# Patient Record
Sex: Male | Born: 1970 | Race: Asian | Hispanic: No | Smoking: Never smoker
Health system: Southern US, Community
[De-identification: ages and names within clinical notes are randomized; demographics above are authoritative.]

---

## 2014-09-05 ENCOUNTER — Emergency Department (HOSPITAL_COMMUNITY): Payer: No Typology Code available for payment source

## 2014-09-05 ENCOUNTER — Emergency Department (HOSPITAL_COMMUNITY)
Admission: EM | Admit: 2014-09-05 | Discharge: 2014-09-05 | Disposition: A | Discharge status: Home / Self Care | Payer: No Typology Code available for payment source | Attending: Emergency Medicine | Admitting: Emergency Medicine

## 2014-09-05 DIAGNOSIS — R0789 Other chest pain: Secondary | ICD-10-CM

## 2014-09-05 DIAGNOSIS — IMO0002 Reserved for concepts with insufficient information to code with codable children: Secondary | ICD-10-CM | POA: Insufficient documentation

## 2014-09-05 DIAGNOSIS — Y9389 Activity, other specified: Secondary | ICD-10-CM | POA: Insufficient documentation

## 2014-09-05 DIAGNOSIS — S298XXA Other specified injuries of thorax, initial encounter: Secondary | ICD-10-CM | POA: Insufficient documentation

## 2014-09-05 DIAGNOSIS — Y92410 Unspecified street and highway as the place of occurrence of the external cause: Secondary | ICD-10-CM | POA: Insufficient documentation

## 2014-09-05 MED ORDER — HYDROCODONE-ACETAMINOPHEN 5-325 MG PO TABS: 1.0000 | ORAL_TABLET | ORAL | Status: DC | PRN

## 2014-09-05 MED ORDER — HYDROCODONE-ACETAMINOPHEN 5-325 MG PO TABS
1.0000 | ORAL_TABLET | Freq: Once | ORAL | Status: AC
  Administered 2014-09-05: 1 via ORAL
  Filled 2014-09-05: qty 1
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
## 2014-09-05 NOTE — Discharge Instructions (Signed)
Take the prescribed medication as directed for pain.  You chest may bruise, monitor this closely. Return to the ED for new or worsening symptoms.
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
## 2014-09-06 NOTE — ED Provider Notes (Signed)
Medical screening examination/treatment/procedure(s) were performed by non-physician practitioner and as supervising physician I was immediately available for consultation/collaboration.   EKG Interpretation None       Yeimi Debnam, MD 09/06/14 0143 

## 2017-05-03 ENCOUNTER — Ambulatory Visit: Payer: Worker's Compensation

## 2017-05-03 ENCOUNTER — Ambulatory Visit
Admission: EM | Admit: 2017-05-03 | Discharge: 2017-05-03 | Disposition: A | Discharge status: Home / Self Care | Payer: Worker's Compensation | Attending: Internal Medicine | Admitting: Internal Medicine

## 2017-05-03 ENCOUNTER — Ambulatory Visit (INDEPENDENT_AMBULATORY_CARE_PROVIDER_SITE_OTHER): Payer: Worker's Compensation

## 2017-05-03 DIAGNOSIS — S63501A Unspecified sprain of right wrist, initial encounter: Secondary | ICD-10-CM

## 2017-05-03 DIAGNOSIS — S6991XA Unspecified injury of right wrist, hand and finger(s), initial encounter: Secondary | ICD-10-CM | POA: Diagnosis not present

## 2017-05-03 MED ORDER — NAPROXEN 500 MG PO TABS: 500.0000 mg | ORAL_TABLET | Freq: Two times a day (BID) | ORAL | 0 refills | Status: DC | PRN

## 2017-05-03 NOTE — Discharge Instructions (Signed)
-  wear wrist splint until symptoms improve -avoid aggravating activity -Naprosyn twice daily with meals as needed for pain -continue with ice, elevation -can also use OTC Tylenol and ibuprofen for pain -Numbers provided for [facility] and [facility] in [place] should symptoms not improve.

## 2017-05-03 NOTE — ED Provider Notes (Signed)
CSN: [id number]     Arrival date & time 05/03/17  1142 History   First MD Initiated Contact with Patient 05/03/17 1235     Chief Complaint  Patient presents with  . Hand Injury   (Consider location/radiation/quality/duration/timing/severity/associated sxs/prior Treatment) Patient is a 46 year old male who presents with complaint to the right hand and wrist. Patient was using a drill while drilling into a piece of pipe on Wednesday when the digit locked in the pipe and the drill torqued and twisted hard in his hand. Patient woke up Thursday with swelling and mild pain that was reported as a 3 out of 10. The injury was treated with cold compress, and Ace bandage around the hand and wrist, and elevation. Patient did not take any pain medications.With still having pain and swelling today, decided to come in for evaluation and x-rays.      History reviewed. No pertinent past medical history. History reviewed. No pertinent surgical history. History reviewed. No pertinent family history. Social History  Substance Use Topics  . Smoking status: Never Smoker  . Smokeless tobacco: Never Used  . Alcohol use No    Review of Systems  Musculoskeletal: Positive for joint swelling (nd pain to the right wrist and hand).    Allergies  Patient has no known allergies.  Home Medications   Prior to Admission medications   Medication Sig Start Date End Date Taking? Authorizing Provider  HYDROcodone-acetaminophen (NORCO/VICODIN) 5-325 MG per tablet Take 1 tablet by mouth every 4 (four) hours as needed. 09/05/14   Garlon HatchetSanders, Lisa M, PA-C  naproxen (NAPROSYN) 500 MG tablet Take 1 tablet (500 mg total) by mouth 2 (two) times daily as needed for mild pain or moderate pain. 05/03/17   Candis SchatzHarris, Kiah Vanalstine D, PA-C   Meds Ordered and Administered this Visit  Medications - No data to display  BP 120/81 (BP Location: Left Arm)   Pulse 74   Temp 98 F (36.7 C) (Oral)   Resp 16   Ht 5\' 1"  (1.549 m)   Wt 117 lb  (53.1 kg)   SpO2 100%   BMI 22.11 kg/m  No data found.   Physical Exam  Constitutional: He appears well-developed and well-nourished.  HENT:  Head: Normocephalic.  Eyes: Conjunctivae are normal. Pupils are equal, round, and reactive to light.  Musculoskeletal: Normal range of motion.       Right hand: He exhibits tenderness ( tenderness with flexion and extension of the wrist) and swelling.       Hands: Erythema to the back of the hand     Urgent Care Course     Procedures (including critical care time)  Labs Review Labs Reviewed - No data to display  Imaging Review Dg Wrist Complete Right  Result Date: 05/03/2017 CLINICAL DATA:  Pt was using drill 3 days ago and drill torqued and injured right hand and wrist. Most pain medial and lat carpal area and swelling on top of right hand over the 2nd thru 5th MC bones EXAM: RIGHT WRIST - COMPLETE 3+ VIEW COMPARISON:  None. FINDINGS: There is no evidence of fracture or dislocation. There is no evidence of arthropathy or other focal bone abnormality. Soft tissues are unremarkable. IMPRESSION: Negative. Electronically Signed   By: Corlis Leak  Hassell M.D.   On: 05/03/2017 13:15   Dg Hand Complete Right  Result Date: 05/03/2017 CLINICAL DATA:  Twisted wrist using to drill EXAM: RIGHT HAND - COMPLETE 3+ VIEW COMPARISON:  None FINDINGS: There is no evidence of fracture  or dislocation. There is no evidence of arthropathy or other focal bone abnormality. Dorsal soft tissue swelling noted. IMPRESSION: Negative. Electronically Signed   By: Signa Kell M.D.   On: 05/03/2017 13:16        MDM   1. Injury of right hand, initial encounter   2. Sprain of right wrist, initial encounter    New Prescriptions   NAPROXEN (NAPROSYN) 500 MG TABLET    Take 1 tablet (500 mg total) by mouth 2 (two) times daily as needed for mild pain or moderate pain.   Patient presents with complaint of right wrist and hand pain after his hand was twisted by a drill on  Wednesday. Patient with some tenderness to palpation to  Base of the right thumb as well as the ulnar aspect of the wrist with some associated swelling. X-rays as above with no obvious fracture.  Patient like Patient was placed into a Velcro splint and patient given a prescription for Naprosyn as needed patient advised to continue his ice compression and elevation. Patient also given contact information for [facility] and [facility] in [place] should his symptoms not improve. Patient also given directions for hand and wrist exercises. Patient verbalized understanding these agreement with plan.  Candis Schatz, PA-C    Candis Schatz, PA-C 05/03/17 1335

## 2017-05-03 NOTE — ED Triage Notes (Signed)
Pt with right hand swelling and mild redness. Was using a drill on Wednesday and woke up the next day with sx. Pain 3/10. Mildly warm to touch.

## 2020-05-26 ENCOUNTER — Ambulatory Visit (HOSPITAL_COMMUNITY)
Admission: EM | Admit: 2020-05-26 | Discharge: 2020-05-26 | Disposition: A | Discharge status: Home / Self Care | Payer: 59 | Attending: Emergency Medicine | Admitting: Emergency Medicine

## 2020-05-26 ENCOUNTER — Encounter (HOSPITAL_COMMUNITY): Payer: Self-pay | Admitting: Emergency Medicine

## 2020-05-26 DIAGNOSIS — M25531 Pain in right wrist: Secondary | ICD-10-CM | POA: Diagnosis not present

## 2020-05-26 DIAGNOSIS — M25532 Pain in left wrist: Secondary | ICD-10-CM | POA: Diagnosis not present

## 2020-05-26 MED ORDER — NAPROXEN 500 MG PO TABS: 500.0000 mg | ORAL_TABLET | Freq: Two times a day (BID) | ORAL | 0 refills | Status: DC

## 2020-05-26 NOTE — ED Provider Notes (Signed)
[place]    CSN: [id number] Arrival date & time: 05/26/20  1028      History   Chief Complaint Chief Complaint  Patient presents with  . Hand Pain  . Foot Pain    HPI Max Klein is a 49 y.o. male no significant past medical history presenting today for evaluation of bilateral wrist pain.  Patient reports over the past week he has had pain in both of his wrist.  He denies one worse than the other.  Denies any redness or swelling.  Denies any specific injury, but states that this increased after he did some heavy lifting.  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  Denies history of wrist issues.  He also has had bilateral ankle pain.  HPI  History reviewed. No pertinent past medical history.  There are no problems to display for this patient.   History reviewed. No pertinent surgical history.     Home Medications    Prior to Admission medications   Medication Sig Start Date End Date Taking? Authorizing Provider  naproxen (NAPROSYN) 500 MG tablet Take 1 tablet (500 mg total) by mouth 2 (two) times daily. 05/26/20   Latyra Jaye, Elesa Hacker, PA-C    Family History Family History  Family history unknown: Yes    Social History Social History   Tobacco Use  . Smoking status: Never Smoker  . Smokeless tobacco: Never Used  Substance Use Topics  . Alcohol use: No  . Drug use: No     Allergies   Patient has no known allergies.   Review of Systems Review of Systems  Constitutional: Negative for fatigue and fever.  Eyes: Negative for redness, itching and visual disturbance.  Respiratory: Negative for shortness of breath.   Cardiovascular: Negative for chest pain and leg swelling.  Gastrointestinal: Negative for nausea and vomiting.  Musculoskeletal: Positive for arthralgias. Negative for myalgias.  Skin: Negative for color change, rash and wound.  Neurological: Negative for dizziness, syncope, weakness, light-headedness and  headaches.     Physical Exam Triage Vital Signs ED Triage Vitals  Enc Vitals Group     BP 05/26/20 1102 128/81     Pulse Rate 05/26/20 1102 90     Resp 05/26/20 1102 16     Temp 05/26/20 1102 97.6 F (36.4 C)     Temp Source 05/26/20 1102 Oral     SpO2 05/26/20 1102 98 %     Weight --      Height --      Head Circumference --      Peak Flow --      Pain Score 05/26/20 1057 2     Pain Loc --      Pain Edu? --      Excl. in [place]? --    No data found.  Updated Vital Signs BP 128/81 (BP Location: Left Arm)   Pulse 90   Temp 97.6 F (36.4 C) (Oral)   Resp 16   SpO2 98%   Visual Acuity Right Eye Distance:   Left Eye Distance:   Bilateral Distance:    Right Eye Near:   Left Eye Near:    Bilateral Near:     Physical Exam Vitals and nursing note reviewed.  Constitutional:      Appearance: He is well-developed.     Comments: No acute distress  HENT:     Head: Normocephalic and atraumatic.     Nose: Nose normal.  Eyes:     Conjunctiva/sclera: Conjunctivae normal.  Cardiovascular:     Rate and Rhythm: Normal rate.  Pulmonary:     Effort: Pulmonary effort is normal. No respiratory distress.  Abdominal:     General: There is no distension.  Musculoskeletal:        General: Normal range of motion.     Cervical back: Neck supple.     Comments: Bilateral wrist without swelling erythema or warmth, no obvious deformity, mild tenderness to palpation around distal radius and ulna and carpal area, radial pulse 2+ bilaterally Full active range of motion of bilateral wrists   Skin:    General: Skin is warm and dry.  Neurological:     Mental Status: He is alert and oriented to person, place, and time.      UC Treatments / Results  Labs (all labs ordered are listed, but only abnormal results are displayed) Labs Reviewed - No data to display  EKG   Radiology No results found.  Procedures Procedures (including critical care time)  Medications Ordered in  UC Medications - No data to display  Initial Impression / Assessment and Plan / UC Course  I have reviewed the triage vital signs and the nursing notes.  Pertinent labs & imaging results that were available during my care of the patient were reviewed by me and considered in my medical decision making (see chart for details).     No mechanism of injury, do not suspect acute bony abnormality.  No erythema or warmth, do not suspect gout or infectious etiology.  Likely overuse/sprain.  Recommending anti-inflammatories and icing.  Discussed strict return precautions. Patient verbalized understanding and is agreeable with plan.  Final Clinical Impressions(s) / UC Diagnoses   Final diagnoses:  Bilateral wrist pain     Discharge Instructions     Naprosyn twice daily for pain Ice wrist Follow up if not improving   ED Prescriptions    Medication Sig Dispense Auth. Provider   naproxen (NAPROSYN) 500 MG tablet Take 1 tablet (500 mg total) by mouth 2 (two) times daily. 30 tablet Ashleymarie Granderson, [place] C, PA-C     PDMP not reviewed this encounter.   Lew Dawes, PA-C 05/26/20 1125

## 2020-05-26 NOTE — Discharge Instructions (Signed)
Naprosyn twice daily for pain Ice wrist Follow up if not improving

## 2020-05-26 NOTE — ED Triage Notes (Signed)
Pt c/o bilateral hand and feet pain x 1 week. Pt states he has been doing some heavy lifting and standing all day at work.

## 2021-07-15 ENCOUNTER — Ambulatory Visit (INDEPENDENT_AMBULATORY_CARE_PROVIDER_SITE_OTHER): Payer: 59

## 2021-07-15 ENCOUNTER — Encounter (HOSPITAL_COMMUNITY): Payer: Self-pay

## 2021-07-15 ENCOUNTER — Ambulatory Visit (HOSPITAL_COMMUNITY)
Admission: EM | Admit: 2021-07-15 | Discharge: 2021-07-15 | Disposition: A | Discharge status: Home / Self Care | Payer: 59 | Attending: Emergency Medicine | Admitting: Emergency Medicine

## 2021-07-15 ENCOUNTER — Other Ambulatory Visit: Payer: Self-pay

## 2021-07-15 DIAGNOSIS — M25461 Effusion, right knee: Secondary | ICD-10-CM | POA: Diagnosis not present

## 2021-07-15 DIAGNOSIS — M25561 Pain in right knee: Secondary | ICD-10-CM

## 2021-07-15 NOTE — Discharge Instructions (Addendum)
  Call to schedule an appointment with Sports Medicine this week for further evaluation and treatment of your knee pain.  No Primary Care Doctor: Call [facility] at  [phone] - they can help you locate a primary care doctor that  accepts your insurance, provides certain services, etc.

## 2021-07-15 NOTE — ED Triage Notes (Signed)
Pt presents with right leg pain X 3 weeks non injury related.

## 2021-07-15 NOTE — ED Provider Notes (Signed)
[facility]    CSN: [id number] Arrival date & time: 07/15/21  1035      History   Chief Complaint Chief Complaint  Patient presents with   Leg Pain    HPI Max Klein is a 50 y.o. male.  Pt speaks broken [place], declined interpreter when asked multiple times.   HPI Ashad Fawbush is a 50 y.o. male presenting to UC with c/o Right knee pain that started about 3 weeks ago.  Pain is sore, worse with bending his knee.  No known injury. No treatments tried PTA.  No prior hx of knee problems. Denies recent travel. No hx of blood clots. Denies calf pain. Denies skin color changes. No fever.    History reviewed. No pertinent past medical history.  There are no problems to display for this patient.   History reviewed. No pertinent surgical history.     Home Medications    Prior to Admission medications   Medication Sig Start Date End Date Taking? Authorizing Provider  naproxen (NAPROSYN) 500 MG tablet Take 1 tablet (500 mg total) by mouth 2 (two) times daily. 05/26/20   Wieters, Junius Creamer, PA-C    Family History Family History  Family history unknown: Yes    Social History Social History   Tobacco Use   Smoking status: Never   Smokeless tobacco: Never  Substance Use Topics   Alcohol use: No   Drug use: No     Allergies   Patient has no known allergies.   Review of Systems Review of Systems  Constitutional:  Negative for chills and fever.  Respiratory:  Negative for shortness of breath.   Cardiovascular:  Negative for chest pain, palpitations and leg swelling.  Musculoskeletal:  Positive for arthralgias. Negative for back pain, gait problem and joint swelling.  Skin:  Negative for color change and wound.  Neurological:  Negative for weakness and numbness.    Physical Exam Triage Vital Signs ED Triage Vitals  Enc Vitals Group     BP 07/15/21 1308 124/77     Pulse Rate 07/15/21 1308 71     Resp 07/15/21 1308 17     Temp 07/15/21 1308 98.9 F (37.2  C)     Temp Source 07/15/21 1308 Oral     SpO2 07/15/21 1308 100 %     Weight --      Height --      Head Circumference --      Peak Flow --      Pain Score 07/15/21 1309 8     Pain Loc --      Pain Edu? --      Excl. in [place]? --    No data found.  Updated Vital Signs BP 124/77 (BP Location: Right Arm)   Pulse 71   Temp 98.9 F (37.2 C) (Oral)   Resp 17   SpO2 100%   Visual Acuity Right Eye Distance:   Left Eye Distance:   Bilateral Distance:    Right Eye Near:   Left Eye Near:    Bilateral Near:     Physical Exam Vitals and nursing note reviewed.  Constitutional:      Appearance: Normal appearance. He is well-developed.  HENT:     Head: Normocephalic and atraumatic.  Cardiovascular:     Rate and Rhythm: Normal rate.     Pulses:          Dorsalis pedis pulses are 2+ on the right side.  Posterior tibial pulses are 2+ on the right side.  Pulmonary:     Effort: Pulmonary effort is normal. No respiratory distress.  Musculoskeletal:        General: Tenderness present. No swelling. Normal range of motion.     Cervical back: Normal range of motion.     Comments: Right knee: diffuse tenderness, increased pain with full flexion of knee. No crepitus or obvious deformity. Right hip and ankle: non-tender.  Calf is soft, non-tender. Normal gait.   Skin:    General: Skin is warm and dry.     Comments: Right knee: skin in tact. No ecchymosis or erythema.   Neurological:     Mental Status: He is alert and oriented to person, place, and time.  Psychiatric:        Behavior: Behavior normal.     UC Treatments / Results  Labs (all labs ordered are listed, but only abnormal results are displayed) Labs Reviewed - No data to display  EKG   Radiology DG Knee Complete 4 Views Right  Result Date: 07/15/2021 CLINICAL DATA:  RIGHT knee pain for 3 weeks.  No known injury. EXAM: RIGHT KNEE - COMPLETE 4+ VIEW COMPARISON:  None. FINDINGS: No acute fracture, subluxation or  dislocation identified. A knee effusion is present. Joint spaces are otherwise unremarkable. No focal bony lesions are present. IMPRESSION: 1. Knee effusion without acute bony abnormality. Electronically Signed   By: Harmon Pier M.D.   On: 07/15/2021 14:07    Procedures Procedures (including critical care time)  Medications Ordered in UC Medications - No data to display  Initial Impression / Assessment and Plan / UC Course  I have reviewed the triage vital signs and the nursing notes.  Pertinent labs & imaging results that were available during my care of the patient were reviewed by me and considered in my medical decision making (see chart for details).     Discussed imaging with pt Hx and exam not concerning for infection or DVT at this time.  Knee sleeve provided for comfort Encouraged f/u with orthopedist or sports medicine for further evaluation and treatment. Pt verbalized understanding and agreement with tx plan.   Final Clinical Impressions(s) / UC Diagnoses   Final diagnoses:  Acute pain of right knee  Knee effusion, right     Discharge Instructions       Call to schedule an appointment with Sports Medicine this week for further evaluation and treatment of your knee pain.  No Primary Care Doctor: Call [facility] at  [phone] - they can help you locate a primary care doctor that  accepts your insurance, provides certain services, etc.       ED Prescriptions   None    PDMP not reviewed this encounter.   Lurene Shadow, PA-C 07/15/21 1440

## 2022-06-19 IMAGING — DX DG KNEE COMPLETE 4+V*R*
4 series · 4 of 4 positions shown · non-contrast
Comparison: None.

CLINICAL DATA: RIGHT knee pain for 3 weeks.  No known injury.

EXAM:
RIGHT KNEE - COMPLETE 4+ VIEW

[knee ap]
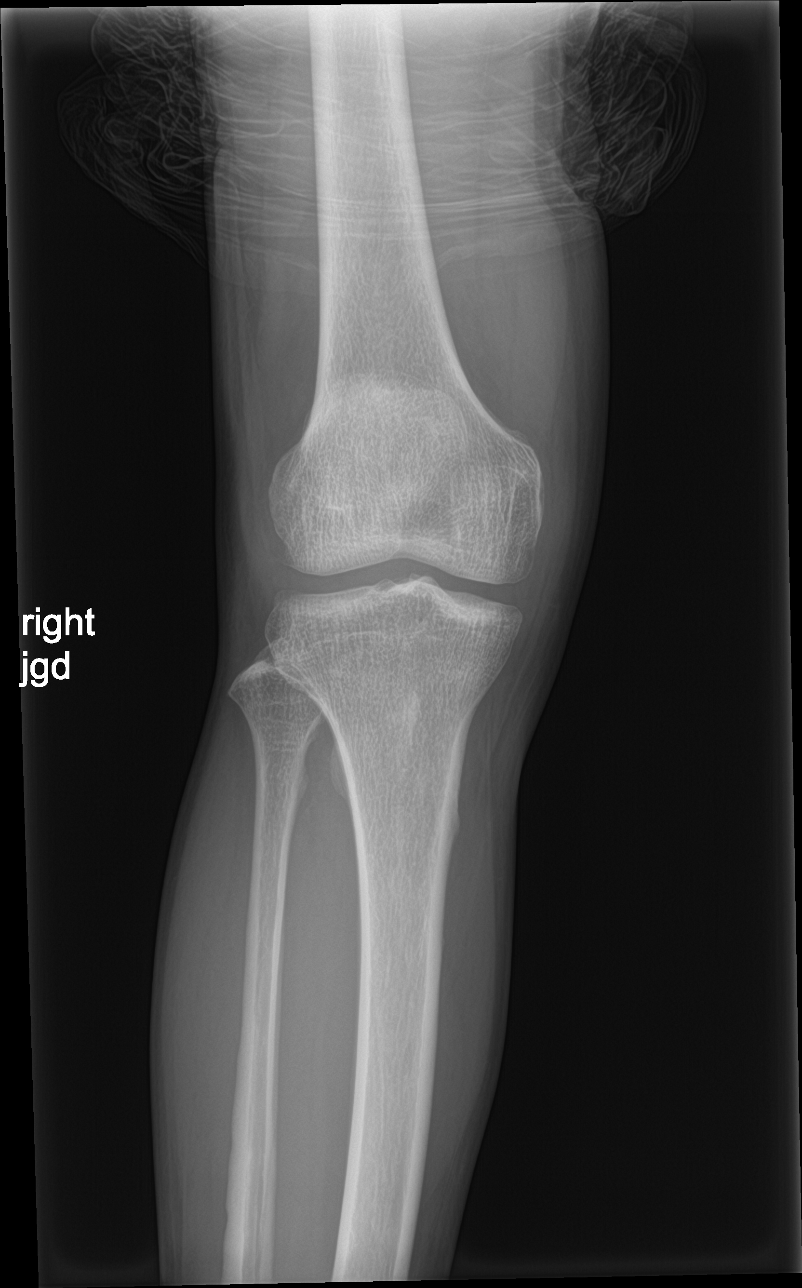

[knee obl (1 of 2)]
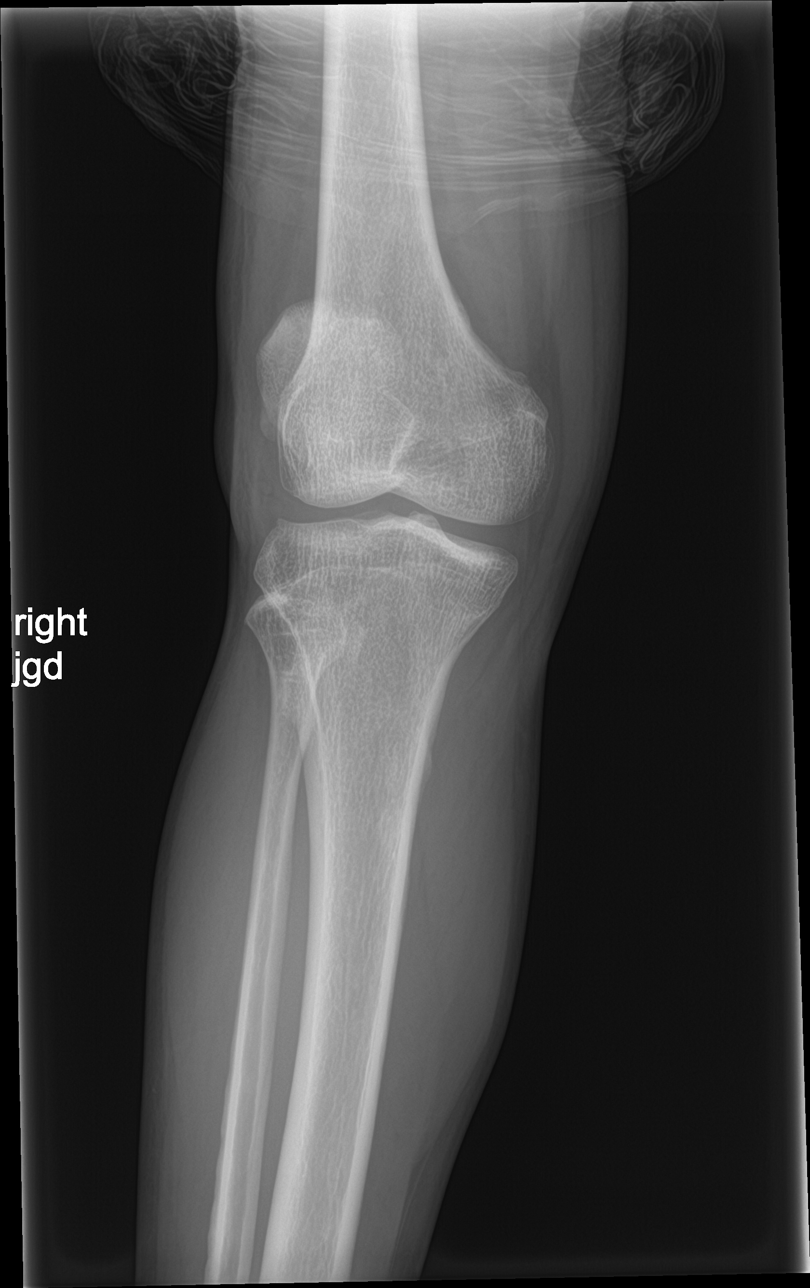

[knee obl (2 of 2)]
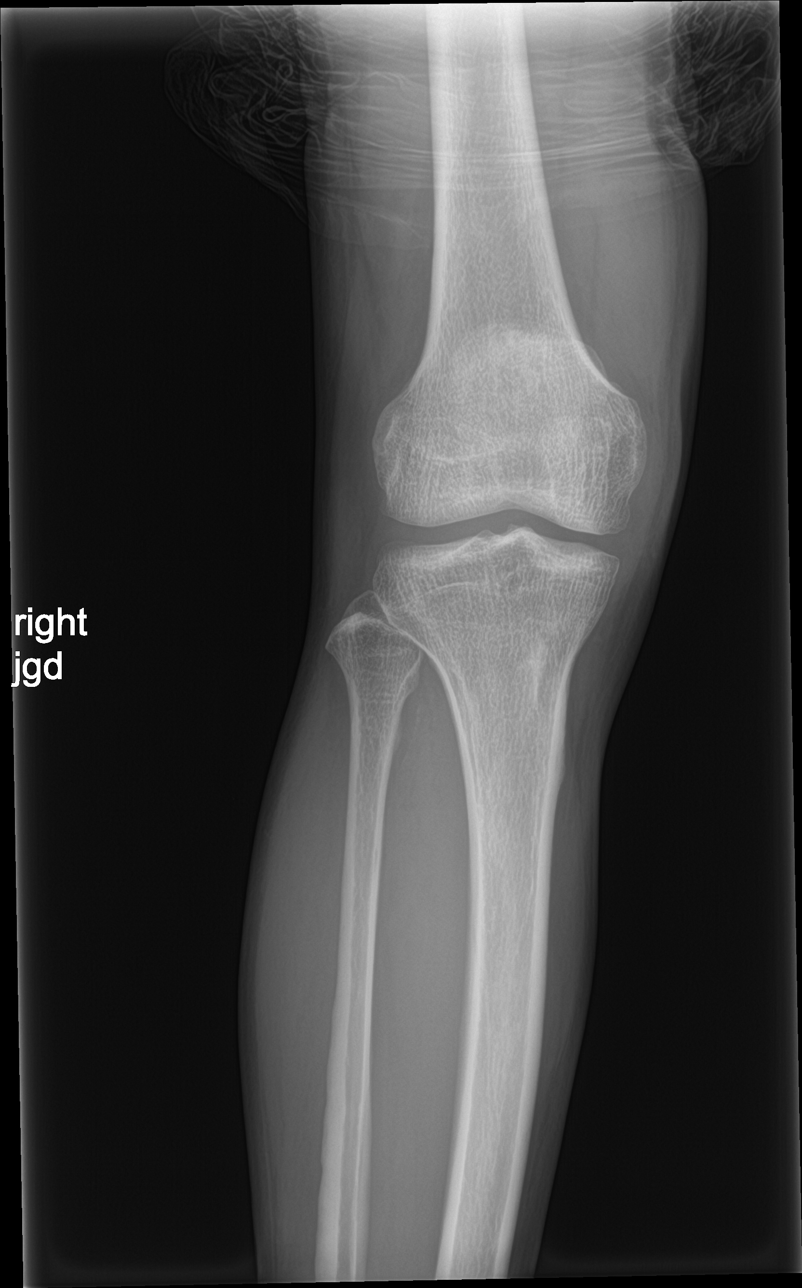

[knee lat]
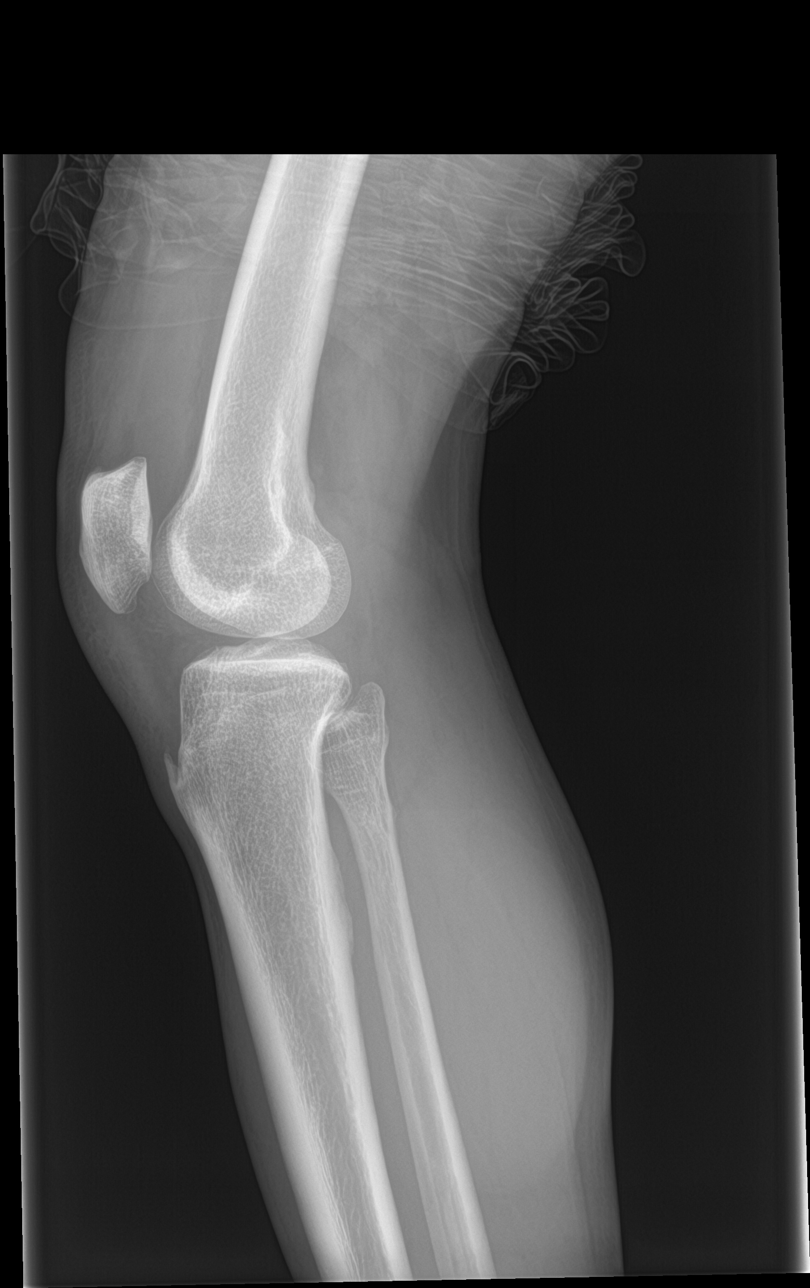

[4 of 4 positions shown; findings below may reference images not displayed]

FINDINGS: No acute fracture, subluxation or dislocation identified.

A knee effusion is present.

Joint spaces are otherwise unremarkable.

No focal bony lesions are present.
IMPRESSION: 1. Knee effusion without acute bony abnormality.

## 2023-01-15 ENCOUNTER — Encounter: Payer: Self-pay | Admitting: Podiatry

## 2023-01-15 ENCOUNTER — Ambulatory Visit (INDEPENDENT_AMBULATORY_CARE_PROVIDER_SITE_OTHER): Payer: 59

## 2023-01-15 ENCOUNTER — Ambulatory Visit (INDEPENDENT_AMBULATORY_CARE_PROVIDER_SITE_OTHER): Payer: 59 | Admitting: Podiatry

## 2023-01-15 DIAGNOSIS — M10471 Other secondary gout, right ankle and foot: Secondary | ICD-10-CM

## 2023-01-15 DIAGNOSIS — M10472 Other secondary gout, left ankle and foot: Secondary | ICD-10-CM

## 2023-01-15 DIAGNOSIS — M79673 Pain in unspecified foot: Secondary | ICD-10-CM | POA: Diagnosis not present

## 2023-01-15 DIAGNOSIS — M7751 Other enthesopathy of right foot: Secondary | ICD-10-CM | POA: Diagnosis not present

## 2023-01-15 DIAGNOSIS — M7750 Other enthesopathy of unspecified foot: Secondary | ICD-10-CM

## 2023-01-15 DIAGNOSIS — M7752 Other enthesopathy of left foot: Secondary | ICD-10-CM

## 2023-01-15 MED ORDER — COLCHICINE 0.6 MG PO TABS: ORAL_TABLET | ORAL | 2 refills | Status: AC

## 2023-01-15 MED ORDER — METHYLPREDNISOLONE 4 MG PO TBPK: ORAL_TABLET | ORAL | 0 refills | Status: DC

## 2023-01-16 LAB — BASIC METABOLIC PANEL
BUN/Creatinine Ratio: 15 (ref 9–20)
BUN: 13 mg/dL (ref 6–24)
CO2: 24 mmol/L (ref 20–29)
Calcium: 8.9 mg/dL (ref 8.7–10.2)
Chloride: 99 mmol/L (ref 96–106)
Creatinine, Ser: 0.89 mg/dL (ref 0.76–1.27)
Glucose: 134 mg/dL — ABNORMAL HIGH (ref 70–99)
Potassium: 3.8 mmol/L (ref 3.5–5.2)
Sodium: 138 mmol/L (ref 134–144)
eGFR: 103 mL/min/{1.73_m2} (ref >59)

## 2023-01-16 LAB — URIC ACID: Uric Acid: 6 mg/dL (ref 3.8–8.4)

## 2023-01-20 ENCOUNTER — Encounter: Payer: Self-pay | Admitting: Podiatry

## 2023-01-20 NOTE — Progress Notes (Signed)
  Subjective:  Patient ID: Max Klein, male    DOB: 16-Dec-1970,  MRN: [id number]  Chief Complaint  Patient presents with   Foot Pain    np bil foot pain( left worse)    52 y.o. male presents with the above complaint. History confirmed with patient.  Most painful areas around both ankles, feels warm swollen hot.  Denies any trauma.  Says he has lots of shellfish and seafood  Objective:  Physical Exam: warm, good capillary refill, no trophic changes or ulcerative lesions, normal DP and PT pulses, normal sensory exam, and pain and tenderness bilateral ankles with edema and painful range of motion.    Radiographs: Multiple views x-ray of both feet: no fracture, dislocation, swelling or degenerative changes noted Assessment:   1. Other secondary acute gout of left ankle   2. Other secondary gout, right ankle and foot      Plan:  Patient was evaluated and treated and all questions answered.  Discussed etiology and treatment option of acute gout.  We reviewed his radiographs together.  I recommend evaluation with laboratory work including a basic metabolic panel and uric acid level.  He will get this done at [facility] tomorrow.  Also recommended methylprednisolone taper.  Discussed the option of corticosteroid injection but he would prefer oral medication.  We discussed the risk about the side effects of methylprednisolone.  I will see him back as needed.  Return if symptoms worsen or fail to improve.

## 2023-01-31 ENCOUNTER — Ambulatory Visit (INDEPENDENT_AMBULATORY_CARE_PROVIDER_SITE_OTHER): Payer: 59 | Admitting: Podiatry

## 2023-01-31 DIAGNOSIS — M25572 Pain in left ankle and joints of left foot: Secondary | ICD-10-CM | POA: Diagnosis not present

## 2023-01-31 DIAGNOSIS — M7672 Peroneal tendinitis, left leg: Secondary | ICD-10-CM

## 2023-01-31 DIAGNOSIS — M25571 Pain in right ankle and joints of right foot: Secondary | ICD-10-CM | POA: Diagnosis not present

## 2023-01-31 DIAGNOSIS — M7671 Peroneal tendinitis, right leg: Secondary | ICD-10-CM

## 2023-01-31 NOTE — Patient Instructions (Signed)

## 2023-02-01 ENCOUNTER — Ambulatory Visit (INDEPENDENT_AMBULATORY_CARE_PROVIDER_SITE_OTHER): Payer: 59

## 2023-02-01 ENCOUNTER — Encounter: Payer: Self-pay | Admitting: Orthopaedic Surgery

## 2023-02-01 ENCOUNTER — Ambulatory Visit (INDEPENDENT_AMBULATORY_CARE_PROVIDER_SITE_OTHER): Payer: 59 | Admitting: Orthopaedic Surgery

## 2023-02-01 DIAGNOSIS — M25562 Pain in left knee: Secondary | ICD-10-CM | POA: Diagnosis not present

## 2023-02-01 MED ORDER — MELOXICAM 7.5 MG PO TABS: 7.5000 mg | ORAL_TABLET | Freq: Two times a day (BID) | ORAL | 2 refills | Status: AC | PRN

## 2023-02-01 NOTE — Progress Notes (Signed)
Office Visit Note   Patient: Max Klein           Date of Birth: December 04, 1971           MRN: [id number] Visit Date: 02/01/2023              Requested by: No referring provider defined for this encounter. PCP: Patient, No Pcp Per   Assessment & Plan: Visit Diagnoses:  1. Acute pain of left knee     Plan: 52 year old gentleman with 2 weeks of left knee pain.  He states that this is getting better.  I will send in prescription for meloxicam to take for couple weeks.  He should follow-up if symptoms persist.  Follow-Up Instructions: No follow-ups on file.   Orders:  Orders Placed This Encounter  Procedures   XR KNEE 3 VIEW LEFT   Meds ordered this encounter  Medications   meloxicam (MOBIC) 7.5 MG tablet    Sig: Take 1 tablet (7.5 mg total) by mouth 2 (two) times daily as needed for pain.    Dispense:  30 tablet    Refill:  2      Procedures: No procedures performed   Clinical Data: No additional findings.   Subjective: Chief Complaint  Patient presents with   Left Knee - Pain    HPI  Patient is a very pleasant 52 year old [place] gentleman who comes in for evaluation of 2 weeks of posterior left knee pain.  Denies any injuries or change in activity.  States that the right knee did this a couple years ago and got better with conservative management.  Mainly wants to get reassurance that there is nothing wrong in his left knee.  Review of Systems  Constitutional: Negative.   HENT: Negative.    Eyes: Negative.   Respiratory: Negative.    Cardiovascular: Negative.   Gastrointestinal: Negative.   Endocrine: Negative.   Genitourinary: Negative.   Skin: Negative.   Allergic/Immunologic: Negative.   Neurological: Negative.   Hematological: Negative.   Psychiatric/Behavioral: Negative.    All other systems reviewed and are negative.    Objective: Vital Signs: There were no vitals taken for this visit.  Physical Exam Vitals and nursing note reviewed.   Constitutional:      Appearance: He is well-developed.  Pulmonary:     Effort: Pulmonary effort is normal.  Abdominal:     Palpations: Abdomen is soft.  Skin:    General: Skin is warm.  Neurological:     Mental Status: He is alert and oriented to person, place, and time.  Psychiatric:        Behavior: Behavior normal.        Thought Content: Thought content normal.        Judgment: Judgment normal.     Ortho Exam  Examination left knee shows no joint effusion.  He has full range of motion without pain.  I do not feel any popliteal masses.  Neurovascular intact distally.  Collaterals and cruciates are stable.  No joint tenderness.  Specialty Comments:  No specialty comments available.  Imaging: No results found.   PMFS History: There are no problems to display for this patient.  History reviewed. No pertinent past medical history.  Family History  Family history unknown: Yes    History reviewed. No pertinent surgical history. Social History   Occupational History   Not on file  Tobacco Use   Smoking status: Never   Smokeless tobacco: Never  Substance and Sexual Activity  Alcohol use: No   Drug use: No   Sexual activity: Not on file

## 2023-02-03 ENCOUNTER — Encounter: Payer: Self-pay | Admitting: Podiatry

## 2023-02-03 NOTE — Progress Notes (Signed)
  Subjective:  Patient ID: Max Klein, male    DOB: Nov 10, 1971,  MRN: [id number]  Chief Complaint  Patient presents with   Gout    Still in pain, left ankle    52 y.o. male presents with the above complaint. History confirmed with patient.  He returns for follow-up he still is having some pain he notes and more on the outside of the ankle now, the medication did help  Objective:  Physical Exam: warm, good capillary refill, no trophic changes or ulcerative lesions, normal DP and PT pulses, normal sensory exam, and pain and tenderness bilateral ankles and some tarsi with edema and painful range of motion.  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 Peroneal tendinitis of both lower legs      Plan:  Patient was evaluated and treated and all questions answered.  Has had some improvement.  I reviewed with him that his uric acid likely is not the cause of the all of his pain, he had evidence of sinus tarsi syndrome today as well as some possible peroneal tendinitis.  I recommended home physical therapy plan and this was given to him.  I also recommend corticosteroid injection into the sinus tarsi bilaterally.  Following consent and prepped with Betadine 10 mg Kenalog 2 mg dexamethasone and 0.5 cc of Marcaine half percent plain.  This was performed bilaterally.  He tolerated this well.  I will see him back in 8 weeks for follow-up  Return in about 8 weeks (around 03/28/2023) for re-check peroneal tendinitis and injections.

## 2023-02-27 ENCOUNTER — Encounter: Payer: Self-pay | Admitting: Orthopaedic Surgery

## 2023-02-27 ENCOUNTER — Ambulatory Visit (INDEPENDENT_AMBULATORY_CARE_PROVIDER_SITE_OTHER): Payer: 59 | Admitting: Orthopaedic Surgery

## 2023-02-27 DIAGNOSIS — M170 Bilateral primary osteoarthritis of knee: Secondary | ICD-10-CM

## 2023-02-27 DIAGNOSIS — M1711 Unilateral primary osteoarthritis, right knee: Secondary | ICD-10-CM

## 2023-02-27 DIAGNOSIS — M1712 Unilateral primary osteoarthritis, left knee: Secondary | ICD-10-CM | POA: Diagnosis not present

## 2023-02-27 MED ORDER — METHYLPREDNISOLONE ACETATE 40 MG/ML IJ SUSP
40.0000 mg | INTRAMUSCULAR | Status: AC | PRN
  Administered 2023-02-27: 40 mg via INTRA_ARTICULAR

## 2023-02-27 MED ORDER — BUPIVACAINE HCL 0.5 % IJ SOLN
2.0000 mL | INTRAMUSCULAR | Status: AC | PRN
  Administered 2023-02-27: 2 mL via INTRA_ARTICULAR

## 2023-02-27 MED ORDER — LIDOCAINE HCL 1 % IJ SOLN
2.0000 mL | INTRAMUSCULAR | Status: AC | PRN
  Administered 2023-02-27: 2 mL

## 2023-02-27 NOTE — Progress Notes (Signed)
   Office Visit Note   Patient: Max Klein           Date of Birth: [birth year]           MRN: [id number] Visit Date: 02/27/2023              Requested by: No referring provider defined for this encounter. PCP: Patient, No Pcp Per   Assessment & Plan: Visit Diagnoses:  1. Primary osteoarthritis of right knee   2. Primary osteoarthritis of left knee     Plan: Impression is chronic bilateral knee pain left greater than right.  Today, we discussed various treatment options to include cortisone injection as he has not previously undergone these in the past.  He is agreeable to this plan.  He will follow-up as needed. Language barrier increased complexity of visit.  Follow-Up Instructions: No follow-ups on file.   Orders:  No orders of the defined types were placed in this encounter.  No orders of the defined types were placed in this encounter.     Procedures: Large Joint Inj: bilateral knee on 02/27/2023 7:11 PM Indications: pain Details: 22 G needle  Arthrogram: No  Medications (Right): 2 mL lidocaine 1 %; 2 mL bupivacaine 0.5 %; 40 mg methylPREDNISolone acetate 40 MG/ML Medications (Left): 2 mL lidocaine 1 %; 2 mL bupivacaine 0.5 %; 40 mg methylPREDNISolone acetate 40 MG/ML Outcome: tolerated well, no immediate complications Patient was prepped and draped in the usual sterile fashion.       Clinical Data: No additional findings.   Subjective: Chief Complaint  Patient presents with   Left Knee - Follow-up    HPI patient is a very pleasant 52 year old [place] gentleman who is here today with an interpreter.  He is here with bilateral knee pain left greater than right.  He was seen in our office last month where he was prescribed meloxicam.  This did help with his knee pain while he was taking the medicine but his pain returned once he stopped taking it.  His symptoms are to the entire aspect of both knees worse with movement of the knees.  He denies any mechanical  symptoms.  No instability.  Review of Systems as detailed in HPI.  All others reviewed and are negative.   Objective: Vital Signs: There were no vitals taken for this visit.  Physical Exam well-developed well-nourished gentleman in no acute distress.  Alert and oriented x 3.  Ortho Exam bilateral knee exam shows no effusion.  Range of motion 0 to 130 degrees.  Mild patellofemoral crepitus on the left none on the right.  Minimal medial joint line tenderness on the left.  Ligaments are stable.  He is neurovascularly intact distally.  Specialty Comments:  No specialty comments available.  Imaging: No new imaging   PMFS History: There are no problems to display for this patient.  History reviewed. No pertinent past medical history.  Family History  Family history unknown: Yes    History reviewed. No pertinent surgical history. Social History   Occupational History   Not on file  Tobacco Use   Smoking status: Never   Smokeless tobacco: Never  Substance and Sexual Activity   Alcohol use: No   Drug use: No   Sexual activity: Not on file

## 2023-03-28 ENCOUNTER — Ambulatory Visit (INDEPENDENT_AMBULATORY_CARE_PROVIDER_SITE_OTHER): Payer: 59 | Admitting: Podiatry

## 2023-03-28 ENCOUNTER — Encounter: Payer: Self-pay | Admitting: Podiatry

## 2023-03-28 DIAGNOSIS — M25571 Pain in right ankle and joints of right foot: Secondary | ICD-10-CM

## 2023-03-28 DIAGNOSIS — M25572 Pain in left ankle and joints of left foot: Secondary | ICD-10-CM | POA: Diagnosis not present

## 2023-03-28 NOTE — Progress Notes (Signed)
  Subjective:  Patient ID: Max Klein, male    DOB: 12/21/1970,  MRN: [id number]  Chief Complaint  Patient presents with   Tendonitis    re-check peroneal tendinitis 8 week follow up - doing much better    52 y.o. male presents with the above complaint. History confirmed with patient.  He returns for follow-up he is doing well the injections helped quite a bit occasional tenderness noted but not severe or consistent  Objective:  Physical Exam: warm, good capillary refill, no trophic changes or ulcerative lesions, normal DP and PT pulses, normal sensory exam, and today has no pain or tenderness in the sinus tarsi or along the peroneal tendons, there is good smooth range of motion.    Radiographs: Multiple views x-ray of both feet: no fracture, dislocation, swelling or degenerative changes noted  Uric acid 6.0 Assessment:   1. Sinus tarsi syndrome of both ankles      Plan:  Patient was evaluated and treated and all questions answered.  Has had quite a bit of improvement with injection therapy.  Return as needed if this returns or worsens.  Return if symptoms worsen or fail to improve.

## 2023-06-19 ENCOUNTER — Ambulatory Visit (INDEPENDENT_AMBULATORY_CARE_PROVIDER_SITE_OTHER): Payer: Managed Care, Other (non HMO) | Admitting: Orthopaedic Surgery

## 2023-06-19 DIAGNOSIS — M25561 Pain in right knee: Secondary | ICD-10-CM

## 2023-06-19 DIAGNOSIS — G8929 Other chronic pain: Secondary | ICD-10-CM | POA: Diagnosis not present

## 2023-06-19 DIAGNOSIS — M25562 Pain in left knee: Secondary | ICD-10-CM | POA: Diagnosis not present

## 2023-06-19 MED ORDER — LIDOCAINE HCL 1 % IJ SOLN
2.0000 mL | INTRAMUSCULAR | Status: AC | PRN
  Administered 2023-06-19: 2 mL

## 2023-06-19 MED ORDER — METHYLPREDNISOLONE ACETATE 40 MG/ML IJ SUSP
40.0000 mg | INTRAMUSCULAR | Status: AC | PRN
  Administered 2023-06-19: 40 mg via INTRA_ARTICULAR

## 2023-06-19 MED ORDER — BUPIVACAINE HCL 0.5 % IJ SOLN
2.0000 mL | INTRAMUSCULAR | Status: AC | PRN
  Administered 2023-06-19: 2 mL via INTRA_ARTICULAR

## 2023-06-19 NOTE — Progress Notes (Signed)
   Office Visit Note   Patient: Max Klein           Date of Birth: 1971-09-17           MRN: [id number] Visit Date: 06/19/2023              Requested by: No referring provider defined for this encounter. PCP: Patient, No Pcp Per   Assessment & Plan: Visit Diagnoses:  1. Chronic pain of both knees     Plan: Impression is recurrent bilateral knee pain.  I feel his symptoms are more related to arthritic pain rather than true structural abnormalities based on his symptoms and clinical exam.  Patient did have good relief for over 3 months from previous cortisone injections.  We have discussed repeat cortisone injections today for which he is agreeable to.  Have also provided him with a viscosupplementation handout.  Follow-up with [place] as needed.  Follow-Up Instructions: Return if symptoms worsen or fail to improve.   Orders:  No orders of the defined types were placed in this encounter.  No orders of the defined types were placed in this encounter.     Procedures: Large Joint Inj: bilateral knee on 06/19/2023 10:24 PM Indications: pain Details: 22 G needle  Arthrogram: No  Medications (Right): 2 mL lidocaine 1 %; 2 mL bupivacaine 0.5 %; 40 mg methylPREDNISolone acetate 40 MG/ML Medications (Left): 2 mL lidocaine 1 %; 2 mL bupivacaine 0.5 %; 40 mg methylPREDNISolone acetate 40 MG/ML Outcome: tolerated well, no immediate complications Patient was prepped and draped in the usual sterile fashion.       Clinical Data: No additional findings.   Subjective: Chief Complaint  Patient presents with   Right Knee - Pain   Left Knee - Pain    HPI patient is a pleasant 52 year old gentleman who comes in today with recurrent bilateral knee pain.  He was seen in our office in March of this year with both knees were injected with cortisone.  He had great relief until about 2 weeks ago.  His symptoms have returned.  The pain is to the entire aspect of both knees and is worse first thing  in the morning as well as when he is climbing ladders.  He does note some improvement of pain after he has been walking for a little bit.  He denies any mechanical symptoms.  Review of Systems as detailed in HPI.  All others reviewed and are negative.   Objective: Vital Signs: There were no vitals taken for this visit.  Physical Exam well-developed well-nourished gentleman in no acute distress.  Alert and oriented x 3.  Ortho Exam bilateral knee exam shows no effusion.  Range of motion 0 to 120 degrees.  Minimal medial joint line tenderness.  Ligaments are stable.  He is neurovascular intact distally.  Specialty Comments:  No specialty comments available.  Imaging: No new imaging   PMFS History: There are no problems to display for this patient.  No past medical history on file.  Family History  Family history unknown: Yes    No past surgical history on file. Social History   Occupational History   Not on file  Tobacco Use   Smoking status: Never   Smokeless tobacco: Never  Substance and Sexual Activity   Alcohol use: No   Drug use: No   Sexual activity: Not on file

## 2024-01-09 ENCOUNTER — Ambulatory Visit (INDEPENDENT_AMBULATORY_CARE_PROVIDER_SITE_OTHER): Payer: Managed Care, Other (non HMO)

## 2024-01-09 ENCOUNTER — Ambulatory Visit (INDEPENDENT_AMBULATORY_CARE_PROVIDER_SITE_OTHER): Payer: 59 | Admitting: Podiatry

## 2024-01-09 DIAGNOSIS — M10472 Other secondary gout, left ankle and foot: Secondary | ICD-10-CM | POA: Diagnosis not present

## 2024-01-09 DIAGNOSIS — M778 Other enthesopathies, not elsewhere classified: Secondary | ICD-10-CM | POA: Diagnosis not present

## 2024-01-09 DIAGNOSIS — M10471 Other secondary gout, right ankle and foot: Secondary | ICD-10-CM

## 2024-01-09 MED ORDER — METHYLPREDNISOLONE 4 MG PO TBPK: ORAL_TABLET | ORAL | 0 refills | Status: DC

## 2024-01-10 ENCOUNTER — Encounter: Payer: Self-pay | Admitting: Podiatry

## 2024-01-10 NOTE — Progress Notes (Signed)
  Subjective:  Patient ID: Max Klein, male    DOB: [birth year],  MRN: [id number]  Chief Complaint  Patient presents with   pain     53 y.o. male presents with the above complaint. History confirmed with patient.  Foot and ankle is painful and swelling on both sides  Objective:  Physical Exam: warm, good capillary refill, no trophic changes or ulcerative lesions, normal DP and PT pulses, normal sensory exam, and pain and tenderness bilateral ankles with edema and painful range of motion.    Radiographs: Multiple views x-ray of both ankles taken today showed no fracture or stress fracture mild ankle joint fusion Assessment:   1. Capsulitis of foot   2. Other secondary gout, right ankle and foot   3. Other secondary acute gout of left ankle      Plan:  Patient was evaluated and treated and all questions answered.  Previously did quite well with corticosteroid injection we discussed this option again today which she declined and preferred an oral medication.  Methylprednisone taper sent to pharmacy.  Will supplement with colchicine if not improving.  Return to see me as needed.  Return if symptoms worsen or fail to improve.

## 2024-06-09 ENCOUNTER — Encounter: Payer: Self-pay | Admitting: Podiatry

## 2024-06-09 ENCOUNTER — Ambulatory Visit (INDEPENDENT_AMBULATORY_CARE_PROVIDER_SITE_OTHER): Payer: Self-pay | Admitting: Podiatry

## 2024-06-09 VITALS — Ht 61.0 in | Wt 117.0 lb

## 2024-06-09 DIAGNOSIS — M19279 Secondary osteoarthritis, unspecified ankle and foot: Secondary | ICD-10-CM | POA: Diagnosis not present

## 2024-06-09 DIAGNOSIS — M1990 Unspecified osteoarthritis, unspecified site: Secondary | ICD-10-CM | POA: Diagnosis not present

## 2024-06-09 MED ORDER — PREDNISONE 10 MG PO TABS: ORAL_TABLET | ORAL | 0 refills | Status: AC

## 2024-06-09 MED ORDER — NAPROXEN 500 MG PO TABS: 500.0000 mg | ORAL_TABLET | Freq: Two times a day (BID) | ORAL | 1 refills | Status: AC

## 2024-06-09 NOTE — Progress Notes (Signed)
  Subjective:  Patient ID: Max Klein, male    DOB: December 19, 1970,  MRN: [id number]  Chief Complaint  Patient presents with   Foot Injury    Patient is here for  bilateral foot pain, patient needs work note for last week and then for this week if he is not able to work due to his foot pain. Patient also needs refill for medications. States his ankles are what bother him the most gout is not an issue right now    Discussed the use of AI scribe software for clinical note transcription with the patient, who gave verbal consent to proceed.  History of Present Illness Max Klein is a 53 year old male who presents with persistent bilateral ankle pain.  He experiences persistent pain in both ankles, primarily around the ankle area. The pain has been present for an unspecified duration and has not completely resolved at any point. He describes the pain as slightly improved compared to before, but it has never fully subsided.  He recalls receiving an injection in the past, which provided relief for approximately two months. However, he is not interested in pursuing further injections at this time.  He is currently experiencing difficulty standing at work due to the pain, which has necessitated time off from his job. He requires a note to justify his absence from work.  He is taking naproxen  for pain management and has been prescribed prednisone, which he is advised to start after completing lab work.      Objective:    Physical Exam VASCULAR: DP and PT pulse palpable. Foot is warm and well-perfused. Capillary fill time is brisk. DERMATOLOGIC: Normal skin turgor, texture, and temperature. No open lesions, rashes, or ulcerations. NEUROLOGIC: Normal sensation to light touch and pressure. No paresthesias. ORTHOPEDIC: Pain and swelling around the subtalar joint with inversion and eversion. No ecchymosis or bruising. No gross deformity. No pain to palpation in other examined joints.   No images are  attached to the encounter.    Results     Assessment:   1. 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  Assessment and Plan Assessment & Plan Chronic ankle pain and swelling Chronic ankle pain and swelling around the subtalar joint with inversion and eversion, persisting without complete resolution. Previous injection provided relief for two months. Differential diagnosis includes joint inflammation or arthritis. - Order lab work including CBC, ESR, CRP, anti-CCP, HLA-B27, rheumatoid factor, and uric acid. - Prescribe prednisone taper to use as needed after lab work is completed. - Prescribe naproxen  for pain management. - If lab work is inconclusive, order MRI of bilateral hind foot. - Provide a work excuse note due to inability to stand at work. - Schedule follow-up visit in two months to review lab results and treatment efficacy.      Return in about 2 months (around 08/10/2024) for after lab work to review.

## 2024-06-20 LAB — CBC WITH DIFFERENTIAL/PLATELET
Basophils Absolute: 0 x10E3/uL (ref 0.0–0.2)
Basos: 1 %
EOS (ABSOLUTE): 0.1 x10E3/uL (ref 0.0–0.4)
Eos: 1 %
Hematocrit: 42.6 % (ref 37.5–51.0)
Hemoglobin: 12 g/dL — ABNORMAL LOW (ref 13.0–17.7)
Immature Grans (Abs): 0 x10E3/uL (ref 0.0–0.1)
Immature Granulocytes: 0 %
Lymphocytes Absolute: 1.9 x10E3/uL (ref 0.7–3.1)
Lymphs: 26 %
MCH: 17.9 pg — ABNORMAL LOW (ref 26.6–33.0)
MCHC: 28.2 g/dL — ABNORMAL LOW (ref 31.5–35.7)
MCV: 64 fL — ABNORMAL LOW (ref 79–97)
Monocytes Absolute: 0.6 x10E3/uL (ref 0.1–0.9)
Monocytes: 8 %
Neutrophils Absolute: 4.7 x10E3/uL (ref 1.4–7.0)
Neutrophils: 64 %
Platelets: 403 x10E3/uL (ref 150–450)
RBC: 6.71 x10E6/uL — ABNORMAL HIGH (ref 4.14–5.80)
RDW: 20 % — ABNORMAL HIGH (ref 11.6–15.4)
WBC: 7.3 x10E3/uL (ref 3.4–10.8)

## 2024-06-20 LAB — URIC ACID: Uric Acid: 6.2 mg/dL (ref 3.8–8.4)

## 2024-06-20 LAB — SEDIMENTATION RATE: Sed Rate: 110 mm/h — ABNORMAL HIGH (ref 0–30)

## 2024-06-20 LAB — C-REACTIVE PROTEIN: CRP: 16 mg/L — ABNORMAL HIGH (ref 0–10)

## 2024-06-20 LAB — HLA-B27 ANTIGEN: HLA B27: POSITIVE

## 2024-06-20 LAB — RHEUMATOID FACTOR: Rheumatoid fact SerPl-aCnc: 35 [IU]/mL — ABNORMAL HIGH (ref <14.0)

## 2024-06-20 LAB — ANTI-CCP AB, IGG + IGA (RDL): Anti-CCP Ab, IgG + IgA (RDL): >250 U — ABNORMAL HIGH (ref <20)

## 2024-07-06 ENCOUNTER — Ambulatory Visit: Payer: Self-pay | Admitting: Podiatry

## 2024-07-06 DIAGNOSIS — M1990 Unspecified osteoarthritis, unspecified site: Secondary | ICD-10-CM

## 2024-08-11 ENCOUNTER — Ambulatory Visit (INDEPENDENT_AMBULATORY_CARE_PROVIDER_SITE_OTHER): Payer: Self-pay | Admitting: Podiatry

## 2024-08-11 ENCOUNTER — Encounter: Payer: Self-pay | Admitting: Podiatry

## 2024-08-11 VITALS — Ht 61.0 in | Wt 117.0 lb

## 2024-08-11 DIAGNOSIS — M05771 Rheumatoid arthritis with rheumatoid factor of right ankle and foot without organ or systems involvement: Secondary | ICD-10-CM | POA: Diagnosis not present

## 2024-08-11 DIAGNOSIS — M05772 Rheumatoid arthritis with rheumatoid factor of left ankle and foot without organ or systems involvement: Secondary | ICD-10-CM

## 2024-08-12 NOTE — Progress Notes (Signed)
  Subjective:  Patient ID: Max Klein, male    DOB: 1971-02-21,  MRN: [id number]  Chief Complaint  Patient presents with   Labs Only    RM 2 Patient is here to review lab results.     Discussed the use of AI scribe software for clinical note transcription with the patient, who gave verbal consent to proceed.  History of Present Illness Max Klein is a 53 year old male who presents with persistent bilateral ankle pain.  He notes he is doing better after taking the medication he completed the lab work.      Objective:    Physical Exam VASCULAR: DP and PT pulse palpable. Foot is warm and well-perfused. Capillary fill time is brisk. DERMATOLOGIC: Normal skin turgor, texture, and temperature. No open lesions, rashes, or ulcerations. NEUROLOGIC: Normal sensation to light touch and pressure. No paresthesias. ORTHOPEDIC: Today no pain to palpation edema or swelling   Results  Collected Updated Procedure   06/11/2024 1139 06/20/2024 0537 CBC with Differential [[id number]]   (Abnormal)  Blood   Component Value Units  WBC 7.3 x10E3/uL  RBC 6.71 High  x10E6/uL  Hemoglobin 12.0 Low  g/dL  Hematocrit 57.3 %  MCV 64 Low  fL  MCH 17.9 Low  pg  MCHC 28.2 Low  g/dL  RDW 79.9 High  %  Platelets 403 x10E3/uL  Neutrophils 64 %  Lymphs 26 %  Monocytes 8 %  Eos 1 %  Basos 1 %  Neutrophils Absolute 4.7 x10E3/uL  Lymphocytes Absolute 1.9 x10E3/uL  Monocytes Absolute 0.6 x10E3/uL  EOS (ABSOLUTE) 0.1 x10E3/uL  Basophils Absolute 0.0 x10E3/uL  Immature Granulocytes 0 %  Immature Grans (Abs) 0.0 x10E3/uL       06/11/2024 1139 06/20/2024 0537 Sedimentation Rate [[id number]]   (Abnormal)  Blood   Component Value Units  Sed Rate 110 High  mm/hr       06/11/2024 1139 06/20/2024 0537 C-reactive protein [[id number]]   (Abnormal)  Blood   Component Value Units  CRP 16 High  mg/L       06/11/2024 1139 06/20/2024 0537 Uric Acid [[id number]]   Blood   Component Value Units  Uric Acid  6.2  mg/dL       92/96/7974 8860 92/87/7974 0537 Rheumatoid factor [[id number]]   (Abnormal)  Blood   Component Value Units  Rheumatoid fact SerPl-aCnc 35.0 High  IU/mL       06/11/2024 1139 06/20/2024 0537 HLA-B27 Antigen [[id number]]   Blood   Component Value  HLA B27 Positive        06/11/2024 1139 06/20/2024 0537 Anti-CCP Ab, IgG + IgA (RDL) [[id number]]   (Abnormal)  Serum          Results     Assessment:   1. 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  Assessment and Plan Assessment & Plan We reviewed his results and discussed he likely has rheumatoid or psoriatic arthritis.  He notes occasional pain in his elbow and fingers as well.  Referral has been placed to rheumatology.  Follow-up with me as needed for this.      No follow-ups on file.
